# Patient Record
Sex: Female | Born: 1995 | Race: White | Hispanic: No | Marital: Married | Smoking: Never smoker
Health system: Southern US, Community
[De-identification: ages and names within clinical notes are randomized; demographics above are authoritative.]

## PROBLEM LIST (undated history)

## (undated) DIAGNOSIS — S129XXA Fracture of neck, unspecified, initial encounter: Secondary | ICD-10-CM

## (undated) DIAGNOSIS — S0291XA Unspecified fracture of skull, initial encounter for closed fracture: Secondary | ICD-10-CM

## (undated) DIAGNOSIS — J45909 Unspecified asthma, uncomplicated: Secondary | ICD-10-CM

---
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
## 2014-01-20 ENCOUNTER — Emergency Department: Payer: Self-pay | Admitting: Emergency Medicine

## 2014-02-01 ENCOUNTER — Emergency Department: Payer: Self-pay | Admitting: Emergency Medicine

## 2015-01-26 ENCOUNTER — Emergency Department: Payer: Self-pay | Admitting: Emergency Medicine

## 2015-03-16 ENCOUNTER — Encounter: Payer: Self-pay | Admitting: Podiatry

## 2015-03-16 ENCOUNTER — Ambulatory Visit (INDEPENDENT_AMBULATORY_CARE_PROVIDER_SITE_OTHER): Payer: BLUE CROSS/BLUE SHIELD | Admitting: Podiatry

## 2015-03-16 VITALS — BP 130/77 | HR 79 | Resp 16 | Ht 64.0 in | Wt 200.0 lb

## 2015-03-16 DIAGNOSIS — L600 Ingrowing nail: Secondary | ICD-10-CM

## 2015-03-16 MED ORDER — HYDROCODONE-ACETAMINOPHEN 10-325 MG PO TABS: 1.0000 | ORAL_TABLET | Freq: Three times a day (TID) | ORAL | Status: DC | PRN

## 2015-03-16 NOTE — Progress Notes (Signed)
   Subjective:    Patient ID: Rachael Schmidt, female    DOB: 06/14/1996, 19 y.o.   MRN: [id number]  HPI Comments: "I have an ingrown"  Patient c/o 1st toes bilateral, both borders, right over left, for 2-3 years off and on. The right is red, swollen and draining. She has been picking at them, clipping, soaking, and neosporin-no help this time. She has had both 1st toes cut at at podiatrist before.  She did not mention but noticed that the 2nd toe left, lateral border is red and swollen as well.  Toe Pain       Review of Systems  All other systems reviewed and are negative.      Objective:   Physical Exam        Assessment & Plan:

## 2015-03-16 NOTE — Patient Instructions (Signed)

## 2015-03-19 ENCOUNTER — Telehealth: Payer: Self-pay | Admitting: *Deleted

## 2015-03-19 ENCOUNTER — Ambulatory Visit: Payer: Self-pay | Admitting: Podiatry

## 2015-03-19 NOTE — Telephone Encounter (Signed)
Returned call-L/M-advised to switch soaks to epsom salt and water and continue using antibiotic ointment and cover. Can take Tylenol or Ibuprofen for pain. Call if no better.

## 2015-03-19 NOTE — Progress Notes (Signed)
Subjective:     Patient ID: Rachael Schmidt, female   DOB: 09/10/1996, 19 y.o.   MRN: [id number]  HPI patient presents with ingrown toenail deformity of both big toes second left that are sore and make it hard for her to wear shoe gear comfortably. States that she's tried to trim them herself without relief of symptoms and presents with her mother-in-law today   Review of Systems  All other systems reviewed and are negative.      Objective:   Physical Exam  Constitutional: She is oriented to person, place, and time.  Cardiovascular: Intact distal pulses.   Musculoskeletal: Normal range of motion.  Neurological: She is oriented to person, place, and time.  Skin: Skin is warm.  Nursing note and vitals reviewed.  neurovascular status intact with muscle strength adequate and range of motion subtalar midtarsal joint within normal limits. 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 I've recommended removal of the nail corners and explained the surgery and risk associated with doing this and patient wants the procedure. Patient understands he is far success understands all possible complications wants procedure and today I infiltrated with 180 mg Xylocaine Marcaine mixture remove the medial lateral borders the hallux bilateral and the medial border the second left exposed matrix and applied phenol 3 applications 30 seconds followed by alcohol lavaged E border followed by sterile dressings. Gave instructions on soaks and reappoint
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
## 2015-03-20 ENCOUNTER — Telehealth: Payer: Self-pay | Admitting: *Deleted

## 2015-03-20 MED ORDER — DOXYCYCLINE HYCLATE 100 MG PO TABS: 100.0000 mg | ORAL_TABLET | Freq: Two times a day (BID) | ORAL | Status: AC

## 2015-03-20 NOTE — Telephone Encounter (Signed)
Patient called stated that the toe has green stuff coming out of it, called in doxycyline into pharmacy for her . Continue with soaks

## 2015-03-26 ENCOUNTER — Encounter: Payer: Self-pay | Admitting: Podiatry

## 2015-03-26 ENCOUNTER — Ambulatory Visit (INDEPENDENT_AMBULATORY_CARE_PROVIDER_SITE_OTHER): Payer: BLUE CROSS/BLUE SHIELD | Admitting: Podiatry

## 2015-03-26 VITALS — Ht 64.0 in | Wt 200.0 lb

## 2015-03-26 DIAGNOSIS — L600 Ingrowing nail: Secondary | ICD-10-CM

## 2015-03-26 DIAGNOSIS — L03119 Cellulitis of unspecified part of limb: Secondary | ICD-10-CM

## 2015-03-26 DIAGNOSIS — L02619 Cutaneous abscess of unspecified foot: Secondary | ICD-10-CM

## 2015-03-26 MED ORDER — SULFAMETHOXAZOLE-TRIMETHOPRIM 800-160 MG PO TABS: ORAL_TABLET | ORAL | Status: AC

## 2015-03-26 MED ORDER — MUPIROCIN 2 % EX OINT: TOPICAL_OINTMENT | CUTANEOUS | Status: AC

## 2015-03-27 NOTE — Progress Notes (Signed)
She presents today 2 weeks status post matrixectomy hallux bilateral. However the hallux right has been painful and draining. She's recently been placed on antibiotics consisting of doxycycline 100 mg twice a day. She states that she's been soaking on a regular basis. She states that the toe still sore and swollen however it has become more easily walked upon and that she can bend the toe now better than she could before.  Objective: Vital signs are stable and oriented 3. The hallux right demonstrates erythema and edema some serosanguineous drainage and some green fibrin that position. I see no signs of probing to bone. At this point the patient does not want to have an IND performed.  Assessment: Abscess hallux right status post infection. Left foot appears to be healing well.  Plan: Discontinue doxycycline start her on Bactrim DS continue soaking twice daily and apply Bactroban ointment which is dispensed today. I will follow-up with her in 1 week at which time if this is not better we will consider an incision and drainage.

## 2015-04-02 ENCOUNTER — Ambulatory Visit: Payer: BLUE CROSS/BLUE SHIELD | Admitting: Podiatry

## 2015-06-23 IMAGING — CR DG CHEST 2V
1 series · 2 of 2 positions shown · non-contrast
Comparison: None.

CLINICAL DATA: Chest pain

EXAM:
CHEST  2 VIEW

[Series 1: pa · 0.17mm/px · 2 of 2 slices shown]
[im 1/2]
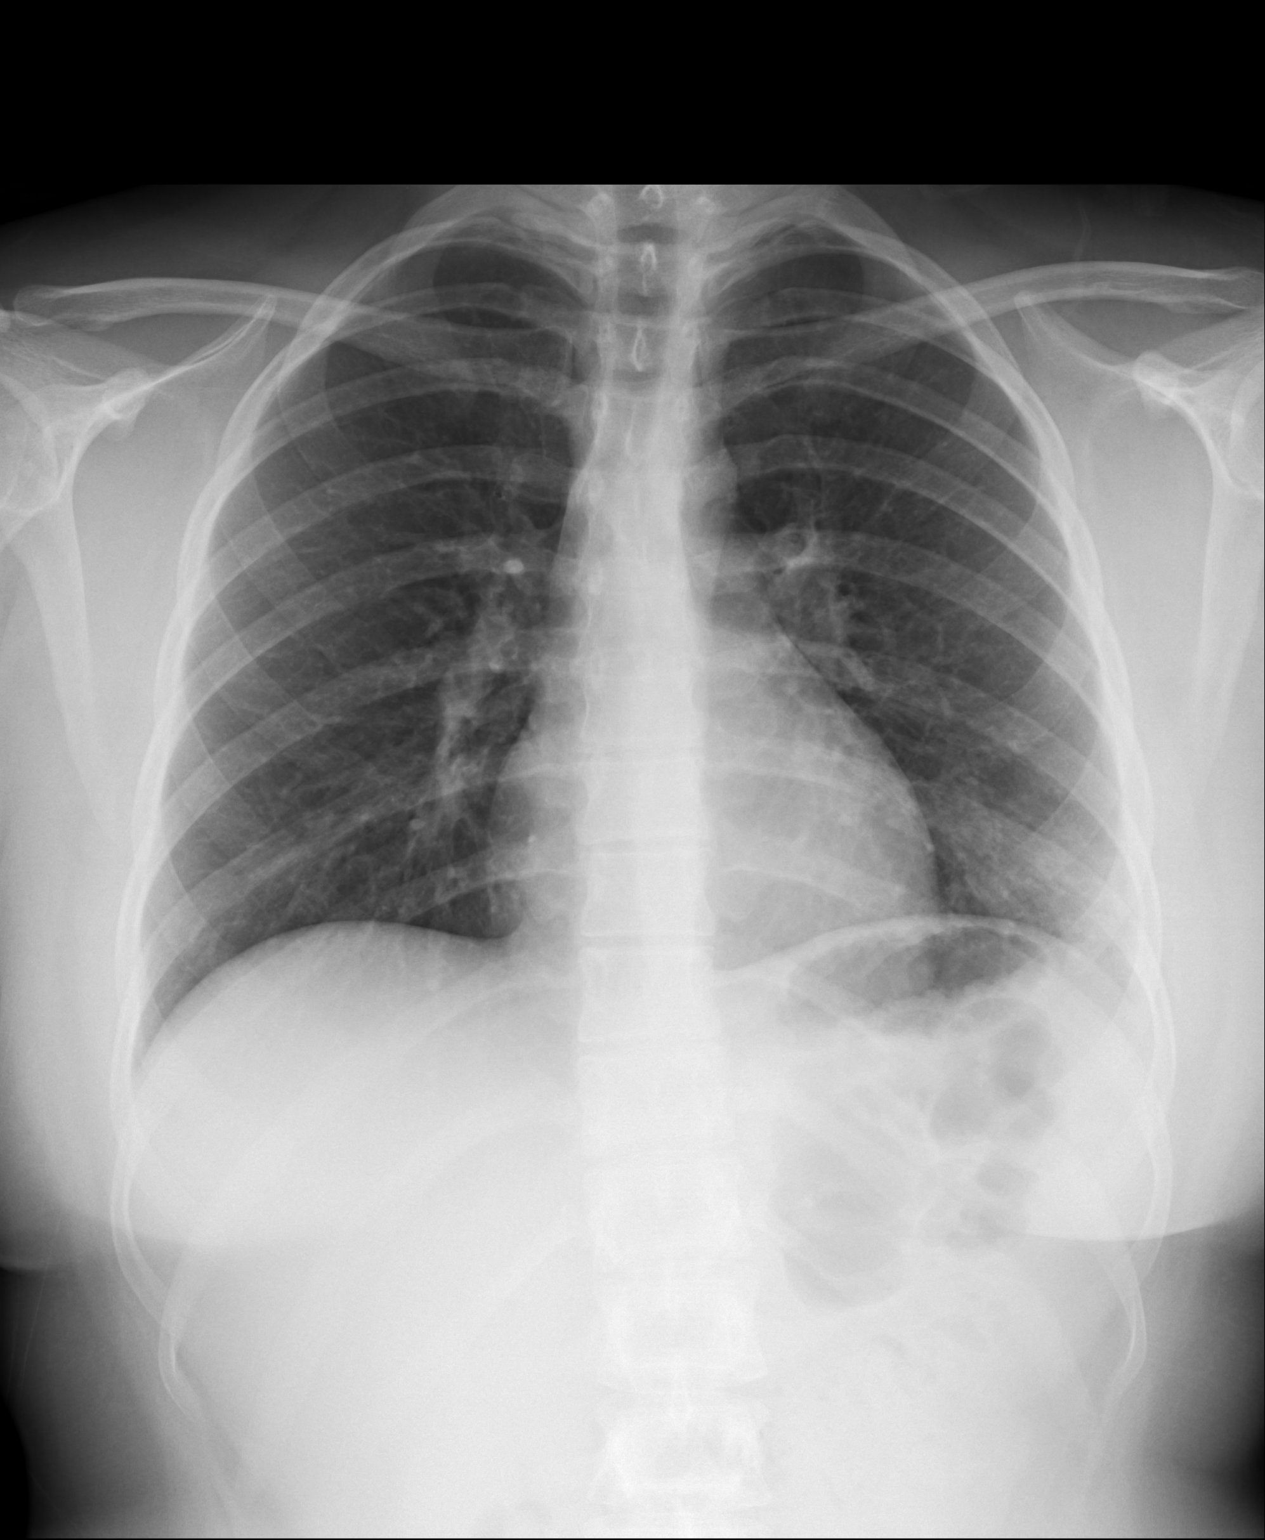
[im 2/2]
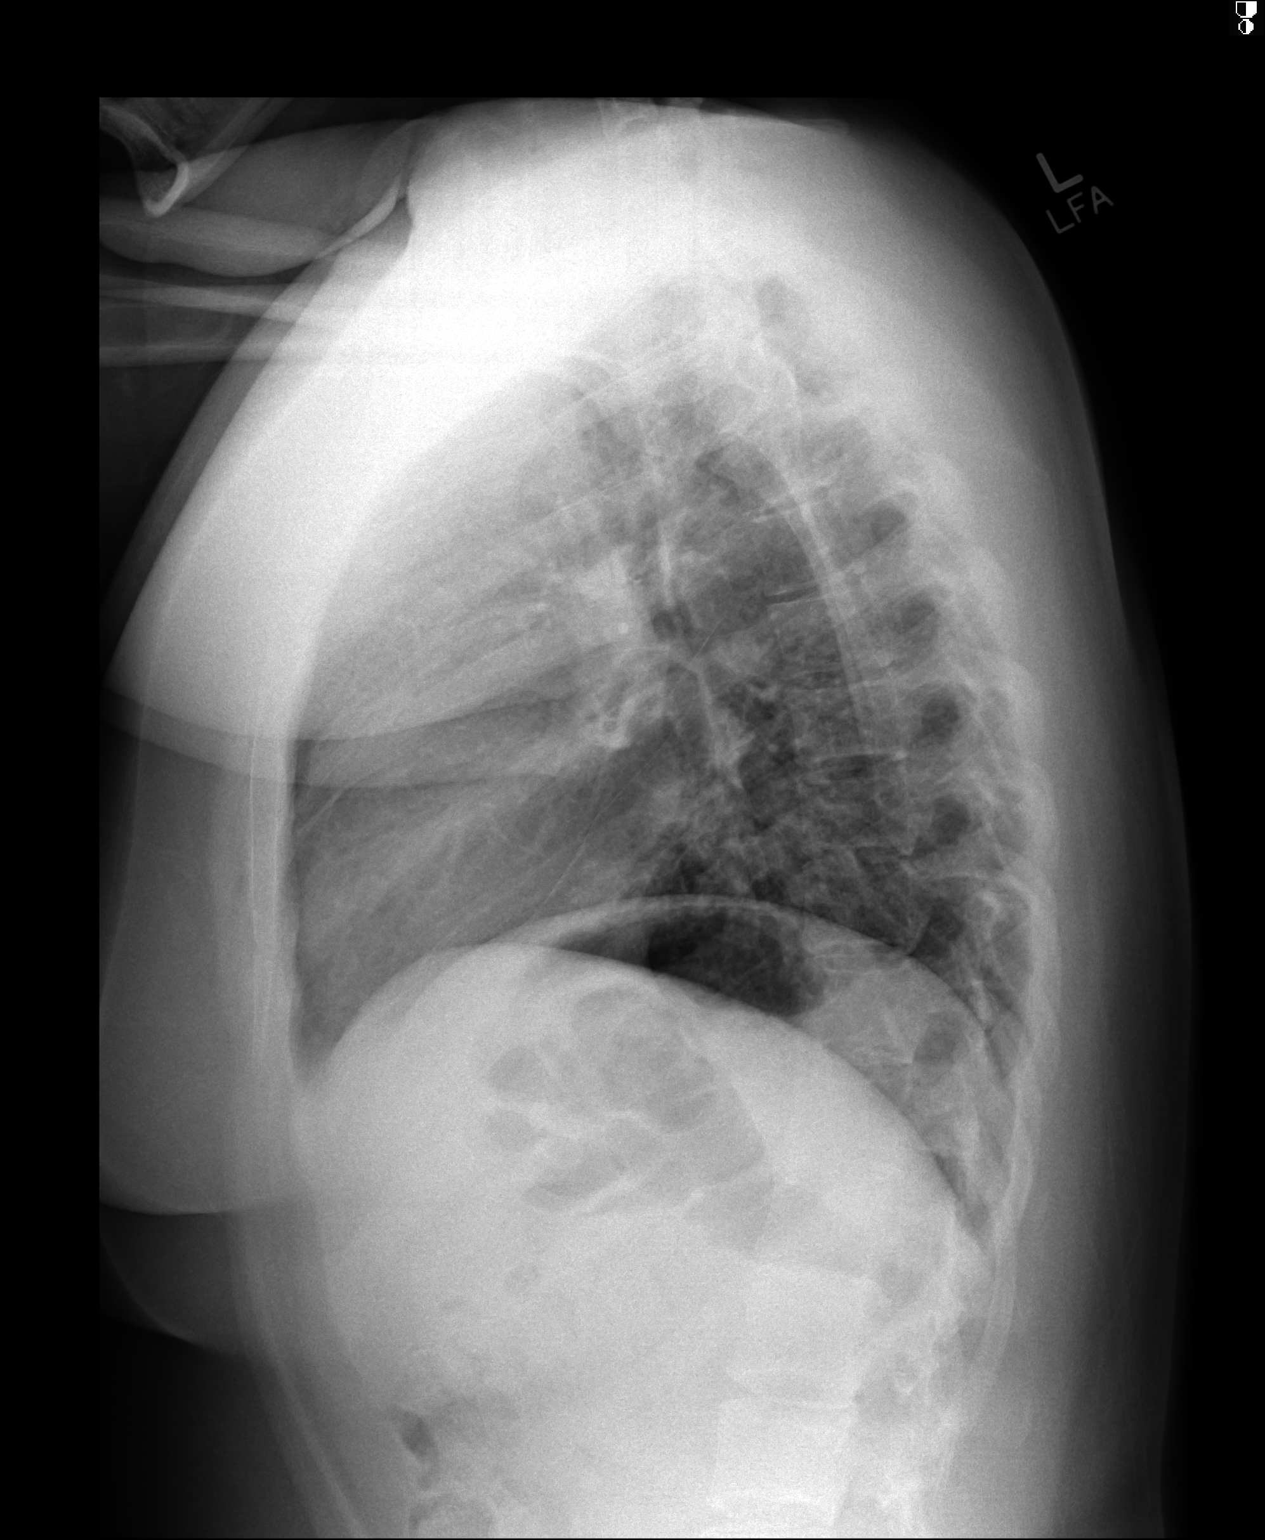

[2 of 2 positions shown; findings below may reference images not displayed]

FINDINGS: There is mild interstitial infiltrate in the left base. Lungs are
otherwise clear. Heart size and pulmonary vascularity are normal. No
adenopathy. No pneumothorax. No bone lesions.
IMPRESSION: Mild interstitial infiltrate left base.

## 2015-09-29 ENCOUNTER — Emergency Department: Payer: BLUE CROSS/BLUE SHIELD

## 2015-09-29 ENCOUNTER — Encounter: Payer: Self-pay | Admitting: Emergency Medicine

## 2015-09-29 ENCOUNTER — Emergency Department
Admission: EM | Admit: 2015-09-29 | Discharge: 2015-09-29 | Disposition: A | Discharge status: Home / Self Care | Payer: BLUE CROSS/BLUE SHIELD | Attending: Emergency Medicine | Admitting: Emergency Medicine

## 2015-09-29 DIAGNOSIS — T148XXA Other injury of unspecified body region, initial encounter: Secondary | ICD-10-CM

## 2015-09-29 DIAGNOSIS — Y998 Other external cause status: Secondary | ICD-10-CM | POA: Diagnosis not present

## 2015-09-29 DIAGNOSIS — Y9389 Activity, other specified: Secondary | ICD-10-CM | POA: Insufficient documentation

## 2015-09-29 DIAGNOSIS — S8001XA Contusion of right knee, initial encounter: Secondary | ICD-10-CM | POA: Diagnosis not present

## 2015-09-29 DIAGNOSIS — Z792 Long term (current) use of antibiotics: Secondary | ICD-10-CM | POA: Insufficient documentation

## 2015-09-29 DIAGNOSIS — Y92410 Unspecified street and highway as the place of occurrence of the external cause: Secondary | ICD-10-CM | POA: Insufficient documentation

## 2015-09-29 DIAGNOSIS — S139XXA Sprain of joints and ligaments of unspecified parts of neck, initial encounter: Secondary | ICD-10-CM

## 2015-09-29 DIAGNOSIS — Z880 Allergy status to penicillin: Secondary | ICD-10-CM | POA: Insufficient documentation

## 2015-09-29 DIAGNOSIS — S20312A Abrasion of left front wall of thorax, initial encounter: Secondary | ICD-10-CM | POA: Diagnosis not present

## 2015-09-29 DIAGNOSIS — S39012A Strain of muscle, fascia and tendon of lower back, initial encounter: Secondary | ICD-10-CM | POA: Insufficient documentation

## 2015-09-29 DIAGNOSIS — S134XXA Sprain of ligaments of cervical spine, initial encounter: Secondary | ICD-10-CM | POA: Insufficient documentation

## 2015-09-29 DIAGNOSIS — S199XXA Unspecified injury of neck, initial encounter: Secondary | ICD-10-CM | POA: Diagnosis present

## 2015-09-29 DIAGNOSIS — S0990XA Unspecified injury of head, initial encounter: Secondary | ICD-10-CM | POA: Diagnosis not present

## 2015-09-29 DIAGNOSIS — S20212A Contusion of left front wall of thorax, initial encounter: Secondary | ICD-10-CM | POA: Diagnosis not present

## 2015-09-29 HISTORY — DX: Fracture of neck, unspecified, initial encounter: S12.9XXA

## 2015-09-29 HISTORY — DX: Unspecified asthma, uncomplicated: J45.909

## 2015-09-29 HISTORY — DX: Unspecified fracture of skull, initial encounter for closed fracture: S02.91XA

## 2015-09-29 MED ORDER — ONDANSETRON 4 MG PO TBDP
4.0000 mg | ORAL_TABLET | Freq: Once | ORAL | Status: AC
  Administered 2015-09-29: 4 mg via ORAL
  Filled 2015-09-29: qty 1

## 2015-09-29 MED ORDER — IBUPROFEN 800 MG PO TABS
800.0000 mg | ORAL_TABLET | Freq: Once | ORAL | Status: AC
  Administered 2015-09-29: 800 mg via ORAL
  Filled 2015-09-29: qty 1

## 2015-09-29 MED ORDER — OXYCODONE-ACETAMINOPHEN 5-325 MG PO TABS
1.0000 | ORAL_TABLET | Freq: Once | ORAL | Status: AC
  Administered 2015-09-29: 1 via ORAL
  Filled 2015-09-29: qty 1

## 2015-09-29 MED ORDER — HYDROCODONE-ACETAMINOPHEN 5-325 MG PO TABS: 1.0000 | ORAL_TABLET | ORAL | Status: AC | PRN

## 2015-09-29 MED ORDER — CYCLOBENZAPRINE HCL 10 MG PO TABS: 10.0000 mg | ORAL_TABLET | Freq: Three times a day (TID) | ORAL | Status: AC | PRN

## 2015-09-29 MED ORDER — IBUPROFEN 800 MG PO TABS
800.0000 mg | ORAL_TABLET | Freq: Three times a day (TID) | ORAL | Status: AC | PRN
Start: 2015-09-29 — End: ?
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
## 2015-09-29 NOTE — ED Notes (Signed)
Patient transported to X-ray via stretcher 

## 2015-09-29 NOTE — ED Notes (Signed)
ER provider at bedside for eval. 

## 2016-01-08 ENCOUNTER — Ambulatory Visit
Admission: EM | Admit: 2016-01-08 | Discharge: 2016-01-08 | Disposition: A | Discharge status: Home / Self Care | Payer: BLUE CROSS/BLUE SHIELD | Attending: Family Medicine | Admitting: Family Medicine

## 2016-01-08 ENCOUNTER — Ambulatory Visit (INDEPENDENT_AMBULATORY_CARE_PROVIDER_SITE_OTHER): Payer: BLUE CROSS/BLUE SHIELD

## 2016-01-08 DIAGNOSIS — S8001XA Contusion of right knee, initial encounter: Secondary | ICD-10-CM

## 2016-01-08 DIAGNOSIS — S5002XA Contusion of left elbow, initial encounter: Secondary | ICD-10-CM | POA: Diagnosis not present

## 2016-01-08 LAB — PREGNANCY, URINE: Preg Test, Ur: NEGATIVE

## 2016-01-08 MED ORDER — TRAMADOL HCL 50 MG PO TABS: 50.0000 mg | ORAL_TABLET | Freq: Two times a day (BID) | ORAL | Status: AC | PRN

## 2016-01-08 MED ORDER — MELOXICAM 15 MG PO TABS: 15.0000 mg | ORAL_TABLET | Freq: Every day | ORAL | Status: AC

## 2016-01-08 MED ORDER — KETOROLAC TROMETHAMINE 60 MG/2ML IM SOLN
60.0000 mg | Freq: Once | INTRAMUSCULAR | Status: DC
Start: 2016-01-08 — End: 2016-01-08

## 2016-01-08 NOTE — ED Provider Notes (Signed)
CSN: [id number]     Arrival date & time 01/08/16  1242 History   First MD Initiated Contact with Patient 01/08/16 1339    .Nurses notes were reviewed. Chief Complaint  Patient presents with  . Fall  . Knee Pain  . Elbow Pain     patient reports Sunday night after dinner [place]. States she thinks she tripped over the rug with dad about 7-8 steps. She denies any loss of consciousness. She denies any significant pain at the left elbow but the right knee is swollen and very painful for her to walk. Her mother total pre-heat on it so she's been putting heat on it. No history of previous knee injury or elbow injury before in the past.   No significant medical problems other than asthma. She does not smoke no significant family medical history. (Consider location/radiation/quality/duration/timing/severity/associated sxs/prior Treatment) Patient is a 20 y.o. female presenting with fall and knee pain. The history is provided by the patient. No language interpreter was used.  Fall This is a new problem. The current episode started 2 days ago. The problem has been gradually worsening. Pertinent negatives include no chest pain, no abdominal pain, no headaches and no shortness of breath. The symptoms are aggravated by walking and bending. Nothing relieves the symptoms. She has tried a warm compress for the symptoms. The treatment provided no relief.  Knee Pain Location:  Knee Knee location:  R knee Pain details:    Quality:  Aching and pressure   Severity:  Moderate   Timing:  Constant Chronicity:  New Foreign body present:  No foreign bodies Relieved by:  None tried Worsened by:  Nothing tried Ineffective treatments:  Heat Associated symptoms: swelling   Risk factors: obesity   Risk factors: no concern for non-accidental trauma, no frequent fractures, no known bone disorder and no recent illness     Past Medical History  Diagnosis Date  . Asthma     . Neck fracture (HCC)   . Skull fracture [facility])    Past Surgical History  Procedure Laterality Date  . Leg surgery  2010   History reviewed. No pertinent family history. Social History  Substance Use Topics  . Smoking status: Never Smoker   . Smokeless tobacco: Never Used  . Alcohol Use: 0.0 oz/week    0 Standard drinks or equivalent per week     Comment: rarely   OB History    No data available     Review of Systems  Respiratory: Negative for shortness of breath.   Cardiovascular: Negative for chest pain.  Gastrointestinal: Negative for abdominal pain.  Musculoskeletal: Positive for myalgias and joint swelling.       Patient with left elbow pain as well  Neurological: Negative for headaches.  All other systems reviewed and are negative.   Allergies  Penicillins  Home Medications   Prior to Admission medications   Medication Sig Start Date End Date Taking? Authorizing Provider  albuterol (PROVENTIL HFA;VENTOLIN HFA) 108 (90 BASE) MCG/ACT inhaler Inhale into the lungs every 6 (six) hours as needed for wheezing or shortness of breath.   Yes Historical Provider, MD  ibuprofen (ADVIL,MOTRIN) 800 MG tablet Take 1 tablet (800 mg total) by mouth every 8 (eight) hours as needed. 09/29/15  Yes Ignacia Bayley, PA-C  cyclobenzaprine (FLEXERIL) 10 MG tablet Take 1 tablet (10 mg total) by mouth every 8 (eight) hours as needed for muscle spasms. 09/29/15   Molly Maduro  Tumey, PA-C  doxycycline (VIBRA-TABS) 100 MG tablet Take 1 tablet (100 mg total) by mouth 2 (two) times daily. 03/20/15   Lenn Sink, DPM  HYDROcodone-acetaminophen (NORCO) 5-325 MG tablet Take 1 tablet by mouth every 4 (four) hours as needed for moderate pain. 09/29/15   Ignacia Bayley, PA-C  meloxicam (MOBIC) 15 MG tablet Take 1 tablet (15 mg total) by mouth daily. 01/08/16   Hassan Rowan, MD  mupirocin ointment (BACTROBAN) 2 % Apply to wound twice a day. 03/26/15   Max T Hyatt, DPM  sulfamethoxazole-trimethoprim (BACTRIM  DS,SEPTRA DS) 800-160 MG per tablet Take two tablets by mouth twice daily until completed 03/26/15   Max T Hyatt, DPM  traMADol (ULTRAM) 50 MG tablet Take 1 tablet (50 mg total) by mouth every 12 (twelve) hours as needed for moderate pain or severe pain. Then taking it mainly at night to help provide relief when trying to sleep 01/08/16   Hassan Rowan, MD   Meds Ordered and Administered this Visit   Medications  ketorolac (TORADOL) injection 60 mg (not administered)    BP 125/55 mmHg  Pulse 83  Temp(Src) 98.2 F (36.8 C) (Tympanic)  Resp 16  Ht 5\' 6"  (1.676 m)  Wt 195 lb (88.451 kg)  BMI 31.49 kg/m2  SpO2 100%  LMP 11/23/2015 (Approximate) No data found.   Physical Exam  Constitutional: She is oriented to person, place, and time. She appears well-developed and well-nourished.  HENT:  Head: Normocephalic and atraumatic.  Eyes: Conjunctivae are normal. Pupils are equal, round, and reactive to light.  Neck: Normal range of motion. Neck supple.  Musculoskeletal: She exhibits tenderness.       Left elbow: She exhibits swelling. Tenderness found.       Legs: Patient has bruising over the right knee swelling is present as well. Pelvis no sensory ligamentous weakness or damage. Patient had discomfort with moving and palpating the knee down. There is ecchymosis around the right knee also.   Patient has tenderness over the left elbow but good range of motion. No marked ecchymosis over the elbow and no warmth.  Neurological: She is alert and oriented to person, place, and time.  Skin: Skin is warm. There is erythema.  Psychiatric: She has a normal mood and affect. Her behavior is normal.  Vitals reviewed.   ED Course  Procedures (including critical care time)  Labs Review Labs Reviewed  PREGNANCY, URINE    Imaging Review Dg Elbow Complete Left  01/08/2016  CLINICAL DATA:  Larey Seat down stairs 2 days ago.  Elbow pain EXAM: LEFT ELBOW - COMPLETE 3+ VIEW COMPARISON:  None. FINDINGS: There  is no evidence of fracture, dislocation, or joint effusion. There is no evidence of arthropathy or other focal bone abnormality. Soft tissues are unremarkable. IMPRESSION: Negative. Electronically Signed   By: Marlan Palau M.D.   On: 01/08/2016 14:26   Dg Knee Ap/lat W/sunrise Right  01/08/2016  CLINICAL DATA:  Pain following fall 2 days prior EXAM: RIGHT KNEE 3 VIEWS COMPARISON:  September 29, 2015 FINDINGS: Frontal, lateral, and sunrise patellar images were obtained. There is prepatellar soft tissue swelling. There is no demonstrable fracture or dislocation. No joint effusion. Joint spaces appear normal. No erosive change. IMPRESSION: There is a degree of prepatellar soft tissue swelling. No fracture or effusion. No appreciable arthropathy. Electronically Signed   By: Bretta Bang III M.D.   On: 01/08/2016 14:26     Visual Acuity Review  Right Eye Distance:   Left Eye  Distance:   Bilateral Distance:    Right Eye Near:   Left Eye Near:    Bilateral Near:         MDM   1. Knee contusion, right, initial encounter   2. Elbow contusion, left, initial encounter   3. Patellar contusion, right, initial encounter    Patient was placed on Mobic 15 mg recommend knee sleeve new pain with opening. She's been using heat recommend no heat for at least 3 or 4 days instead recommend cold therapy such as ice. Will also give her tramadol to let her sleep at night. Work note for Tuesday Wednesday and Thursday explained to her if she is not better by Thursday she'll need see her PCP for reevaluation and for possible extension on the work note    Note: This dictation was prepared with [employer] dictation along with smaller [employer]. Any transcriptional errors that result from this process are unintentional.    Hassan Rowan, MD 01/08/16 1453

## 2016-01-08 NOTE — Discharge Instructions (Signed)
Elbow Contusion  An elbow contusion is a deep bruise of the elbow. Contusions happen when an injury causes bleeding under the skin. Signs of bruising include pain, puffiness (swelling), and discolored skin. The contusion may turn blue, purple, or yellow. HOME CARE  Put ice on the injured area.  Put ice in a plastic bag.  Place a towel between your skin and the bag.  Leave the ice on for 15-20 minutes, 03-04 times a day.  Only take medicines as told by your doctor.  Rest your elbow until the pain and puffiness are better.  Raise (elevate) your elbow to lessen puffiness.  Put on an elastic wrap as told by your doctor. You can take it off for sleeping, showers, and baths. If your fingers get cold, blue, or lose feeling (numb), take the wrap off. Put the wrap back on more loosely.  Use your elbow only as told by your doctor. If you are asked to do elbow exercises, do them as told.  Keep all doctor visits as told. GET HELP RIGHT AWAY IF:  You have more redness, puffiness, or pain in your elbow.  Your puffiness or pain is not helped by medicines.  You have puffiness of the hand and fingers.  You are not able to move your fingers or wrist.  You start to lose feeling in your hand or fingers.  Your fingers or hand become cold or blue. MAKE SURE YOU:   Understand these instructions.  Will watch your condition.  Will get help right away if you are not doing well or get worse.   This information is not intended to replace advice given to you by your health care provider. Make sure you discuss any questions you have with your health care provider.   Document Released: 11/06/2011 Document Revised: 05/18/2012 Document Reviewed: 07/02/2015 Elsevier Interactive Patient Education 2016 Elsevier Inc.  Cryotherapy Cryotherapy is when you put ice on your injury. Ice helps lessen pain and puffiness (swelling) after an injury. Ice works the best when you start using it in the first 24 to  48 hours after an injury. HOME CARE  Put a dry or damp towel between the ice pack and your skin.  You may press gently on the ice pack.  Leave the ice on for no more than 10 to 20 minutes at a time.  Check your skin after 5 minutes to make sure your skin is okay.  Rest at least 20 minutes between ice pack uses.  Stop using ice when your skin loses feeling (numbness).  Do not use ice on someone who cannot tell you when it hurts. This includes small children and people with memory problems (dementia). GET HELP RIGHT AWAY IF:  You have white spots on your skin.  Your skin turns blue or pale.  Your skin feels waxy or hard.  Your puffiness gets worse. MAKE SURE YOU:   Understand these instructions.  Will watch your condition.  Will get help right away if you are not doing well or get worse.   This information is not intended to replace advice given to you by your health care provider. Make sure you discuss any questions you have with your health care provider.   Document Released: 05/05/2008 Document Revised: 02/09/2012 Document Reviewed: 07/10/2011 Elsevier Interactive Patient Education 2016 Elsevier Inc.  Contusion A contusion is a deep bruise. Contusions happen when an injury causes bleeding under the skin. Symptoms of bruising include pain, swelling, and discolored skin. The skin may  turn blue, purple, or yellow. HOME CARE   Rest the injured area.  If told, put ice on the injured area.  Put ice in a plastic bag.  Place a towel between your skin and the bag.  Leave the ice on for 20 minutes, 2-3 times per day.  If told, put light pressure (compression) on the injured area using an elastic bandage. Make sure the bandage is not too tight. Remove it and put it back on as told by your doctor.  If possible, raise (elevate) the injured area above the level of your heart while you are sitting or lying down.  Take over-the-counter and prescription medicines only as told  by your doctor. GET HELP IF:  Your symptoms do not get better after several days of treatment.  Your symptoms get worse.  You have trouble moving the injured area. GET HELP RIGHT AWAY IF:   You have very bad pain.  You have a loss of feeling (numbness) in a hand or foot.  Your hand or foot turns pale or cold.   This information is not intended to replace advice given to you by your health care provider. Make sure you discuss any questions you have with your health care provider.   Document Released: 05/05/2008 Document Revised: 08/08/2015 Document Reviewed: 04/04/2015 Elsevier Interactive Patient Education [employer]

## 2016-01-08 NOTE — ED Notes (Signed)
States fell down stairs Sunday landing on left arm and left knee "popped".

## 2016-08-11 ENCOUNTER — Emergency Department (HOSPITAL_BASED_OUTPATIENT_CLINIC_OR_DEPARTMENT_OTHER)
Admit: 2016-08-11 | Discharge: 2016-08-11 | Disposition: A | Discharge status: Home / Self Care | Payer: BLUE CROSS/BLUE SHIELD | Attending: Emergency Medicine | Admitting: Emergency Medicine

## 2016-08-11 ENCOUNTER — Emergency Department (HOSPITAL_COMMUNITY): Payer: BLUE CROSS/BLUE SHIELD

## 2016-08-11 ENCOUNTER — Encounter (HOSPITAL_COMMUNITY): Payer: Self-pay | Admitting: *Deleted

## 2016-08-11 ENCOUNTER — Emergency Department (HOSPITAL_COMMUNITY)
Admission: EM | Admit: 2016-08-11 | Discharge: 2016-08-11 | Disposition: A | Discharge status: Home / Self Care | Payer: BLUE CROSS/BLUE SHIELD | Attending: Emergency Medicine | Admitting: Emergency Medicine

## 2016-08-11 DIAGNOSIS — J45909 Unspecified asthma, uncomplicated: Secondary | ICD-10-CM | POA: Insufficient documentation

## 2016-08-11 DIAGNOSIS — M79662 Pain in left lower leg: Secondary | ICD-10-CM | POA: Diagnosis not present

## 2016-08-11 DIAGNOSIS — M79609 Pain in unspecified limb: Secondary | ICD-10-CM | POA: Diagnosis not present

## 2016-08-11 DIAGNOSIS — R071 Chest pain on breathing: Secondary | ICD-10-CM | POA: Diagnosis present

## 2016-08-11 DIAGNOSIS — R0789 Other chest pain: Secondary | ICD-10-CM | POA: Diagnosis not present

## 2016-08-11 DIAGNOSIS — M79605 Pain in left leg: Secondary | ICD-10-CM

## 2016-08-11 LAB — BASIC METABOLIC PANEL
Anion gap: 8 (ref 5–15)
BUN: 7 mg/dL (ref 6–20)
CALCIUM: 10.2 mg/dL (ref 8.9–10.3)
CO2: 25 mmol/L (ref 22–32)
CREATININE: 0.66 mg/dL (ref 0.44–1.00)
Chloride: 107 mmol/L (ref 101–111)
GFR calc non Af Amer: >60 mL/min (ref >60)
Glucose, Bld: 90 mg/dL (ref 65–99)
Potassium: 4.2 mmol/L (ref 3.5–5.1)
SODIUM: 140 mmol/L (ref 135–145)

## 2016-08-11 LAB — I-STAT TROPONIN, ED: TROPONIN I, POC: 0 ng/mL (ref 0.00–0.08)

## 2016-08-11 LAB — CBC
HCT: 42.4 % (ref 36.0–46.0)
Hemoglobin: 14 g/dL (ref 12.0–15.0)
MCH: 29.5 pg (ref 26.0–34.0)
MCHC: 33 g/dL (ref 30.0–36.0)
MCV: 89.5 fL (ref 78.0–100.0)
PLATELETS: 266 10*3/uL (ref 150–400)
RBC: 4.74 MIL/uL (ref 3.87–5.11)
RDW: 12.2 % (ref 11.5–15.5)
WBC: 9.7 10*3/uL (ref 4.0–10.5)

## 2016-08-11 LAB — D-DIMER, QUANTITATIVE: D-Dimer, Quant: <0.27 ug/mL-FEU (ref 0.00–0.50)

## 2016-08-11 NOTE — ED Notes (Signed)
Placed patient into a gown 

## 2016-08-11 NOTE — Discharge Instructions (Signed)
Please use number provide in this discharge paper to find a primary care provider for further management of your health. Return if your condition worsen or if you have other concerns. Take tylenol or ibuprofen at home as needed for your calf pain.

## 2016-08-11 NOTE — ED Notes (Signed)
Patient transported to Ultrasound 

## 2016-08-11 NOTE — ED Triage Notes (Signed)
Patient complains of chest pain that she describes as sharp since last pm, concerned because she first had calf pain the past 2 days, denies trauma. No cold or cough

## 2016-08-11 NOTE — ED Provider Notes (Signed)
[facility]-EMERGENCY DEPT Provider Note   CSN: [id number] Arrival date & time: 08/11/16  0859     History   Chief Complaint Chief Complaint  Patient presents with  . Leg Pain  . Chest Pain    HPI Rachael Schmidt is a 20 y.o. female.  HPI   20 year old female with history of asthma presenting with complaints of chest pain. Patient report 2 days ago she developed gradual onset of left calf pain. She described pain as a throbbing sensation, persistent, nothing seems to make it better or worse. She also felt there may be some associated swelling to her left leg. Yesterday she developed sharp diffuse chest pain, worsening with deep breathing with some associated mild shortness of breath when the pain is intense. The symptoms are new to her. She denies any recent strenuous activities or anything else that may have actually to her pain. She admits that she has been driving at least 4 hours daily to and from her job which she started approximate 2-3 weeks ago. She denies any prior history of PE or DVT, no recent surgery, prolonged bed rest, active cancer, taking oral hormone, or having hemoptysis. She is here at the urging of her grandmother who has a strong history of blood clot. Patient has history of asthma but denies any wheezing. No strong family history of cardiac disease.  Past Medical History:  Diagnosis Date  . Asthma   . Neck fracture (HCC)   . Skull fracture (HCC)     There are no active problems to display for this patient.   Past Surgical History:  Procedure Laterality Date  . LEG SURGERY  2010    OB History    No data available       Home Medications    Prior to Admission medications   Medication Sig Start Date End Date Taking? Authorizing Provider  albuterol (PROVENTIL HFA;VENTOLIN HFA) 108 (90 BASE) MCG/ACT inhaler Inhale into the lungs every 6 (six) hours as needed for wheezing or shortness of breath.    Historical Provider, MD  cyclobenzaprine (FLEXERIL) 10 MG  tablet Take 1 tablet (10 mg total) by mouth every 8 (eight) hours as needed for muscle spasms. 09/29/15   Ignacia Bayley, PA-C  doxycycline (VIBRA-TABS) 100 MG tablet Take 1 tablet (100 mg total) by mouth 2 (two) times daily. 03/20/15   Lenn Sink, DPM  HYDROcodone-acetaminophen (NORCO) 5-325 MG tablet Take 1 tablet by mouth every 4 (four) hours as needed for moderate pain. 09/29/15   Ignacia Bayley, PA-C  ibuprofen (ADVIL,MOTRIN) 800 MG tablet Take 1 tablet (800 mg total) by mouth every 8 (eight) hours as needed. 09/29/15   Ignacia Bayley, PA-C  meloxicam (MOBIC) 15 MG tablet Take 1 tablet (15 mg total) by mouth daily. 01/08/16   Hassan Rowan, MD  mupirocin ointment (BACTROBAN) 2 % Apply to wound twice a day. 03/26/15   Max T Hyatt, DPM  sulfamethoxazole-trimethoprim (BACTRIM DS,SEPTRA DS) 800-160 MG per tablet Take two tablets by mouth twice daily until completed 03/26/15   Max T Hyatt, DPM  traMADol (ULTRAM) 50 MG tablet Take 1 tablet (50 mg total) by mouth every 12 (twelve) hours as needed for moderate pain or severe pain. Then taking it mainly at night to help provide relief when trying to sleep 01/08/16   Hassan Rowan, MD    Family History No family history on file.  Social History Social History  Substance Use Topics  . Smoking status: Never Smoker  . Smokeless tobacco:  Never Used  . Alcohol use 0.0 oz/week     Comment: rarely     Allergies   Penicillins   Review of Systems Review of Systems  All other systems reviewed and are negative.    Physical Exam Updated Vital Signs BP 134/89 (BP Location: Left Arm)   Pulse 69   Temp 98.4 F (36.9 C) (Oral)   Resp 18   SpO2 100%   Physical Exam  Constitutional: She appears well-developed and well-nourished. No distress.  Obese female laying in bed in no acute discomfort.  HENT:  Head: Atraumatic.  Eyes: Conjunctivae are normal.  Neck: Neck supple. No JVD present.  Cardiovascular: Normal rate.   Pulmonary/Chest: Effort normal and  breath sounds normal. She exhibits no tenderness.  Abdominal: Soft. There is no tenderness.  Musculoskeletal: She exhibits tenderness (Mild tenderness to left calf region on palpation. Bilateral lower shin to use without palpable cords, erythema, or edema. Intact distal pedal pulses.).  Neurological: She is alert.  Skin: No rash noted.  Psychiatric: She has a normal mood and affect.  Nursing note and vitals reviewed.    ED Treatments / Results  Labs (all labs ordered are listed, but only abnormal results are displayed) Labs Reviewed  BASIC METABOLIC PANEL  CBC  D-DIMER, QUANTITATIVE (NOT AT [facility])  I-STAT TROPOININ, ED  POC URINE PREG, ED    EKG  EKG Interpretation None      Date: 08/11/2016  Rate: 71  Rhythm: normal sinus rhythm with sinus arrhythmia  QRS Axis: normal  Intervals: normal  ST/T Wave abnormalities: normal  Conduction Disutrbances: none  Narrative Interpretation:   Old EKG Reviewed: No significant changes noted     Radiology Dg Chest Port 1 View  Result Date: 08/11/2016 CLINICAL DATA:  Left anterior chest since last night. History of asthma. EXAM: PORTABLE CHEST 1 VIEW COMPARISON:  02/01/2014 FINDINGS: Lungs are adequately inflated without consolidation or effusion. Cardiomediastinal silhouette and remainder of the exam is unchanged. Moderate overlying soft tissues. IMPRESSION: No active disease. Electronically Signed   By: Elberta Fortis M.D.   On: 08/11/2016 10:55    Procedures Procedures (including critical care time)  Progress Notes Date of Service: 08/11/2016 11:29 AM Candace Leotis Shames, RVS  Vascular Lab    [] Hide copied text [] Hover for attribution information VASCULAR LAB PRELIMINARY  PRELIMINARY  PRELIMINARY  PRELIMINARY  Left lower extremity venous duplex completed.    Preliminary report:  There is no DVT or SVT noted in the left lower extremity.   KANADY, CANDACE, RVT 08/11/2016, 11:29 AM         Medications Ordered in  ED Medications - No data to display   Initial Impression / Assessment and Plan / ED Course  I have reviewed the triage vital signs and the nursing notes.  Pertinent labs & imaging results that were available during my care of the patient were reviewed by me and considered in my medical decision making (see chart for details).  Clinical Course    BP 117/75 (BP Location: Right Arm)   Pulse 77   Temp 98.4 F (36.9 C) (Oral)   Resp 20   SpO2 98%    Final Clinical Impressions(s) / ED Diagnoses   Final diagnoses:  Left leg pain  Other chest pain    New Prescriptions New Prescriptions   No medications on file   10:30 AM Patient presents with left calf pain and now having sharp chest pain. Pain is atypical for ACS. With her increased  traveling, and obesity, she increased risk for DVT. We'll perform a Doppler ultrasound of her left lower extremities. Even though I have low suspicion for PE, I will obtain a d-dimer. Will monitor closely.  12:36 PM Doppler study of the left lower extremities show no evidence of DVT. Her d-dimer is negative therefore low suspicion for PE. EKG shows no acute ischemic changes, normal troponin, and other labs are otherwise reassuring. At this time I have not identified any finding to suggest an acute emergent medical condition. I encouraged patient to find a primary care provider for further management of her health. Return precaution discussed. Otherwise patient is stable for discharge.    Fayrene HelperBowie Krishang Reading, PA-C 08/11/16 1239    Rolan BuccoMelanie Belfi, MD 08/11/16 1432

## 2016-08-11 NOTE — Progress Notes (Signed)
VASCULAR LAB PRELIMINARY  PRELIMINARY  PRELIMINARY  PRELIMINARY  Left lower extremity venous duplex completed.    Preliminary report:  There is no DVT or SVT noted in the left lower extremity.   Cyrene Gharibian, RVT 08/11/2016, 11:29 AM

## 2017-04-23 ENCOUNTER — Encounter: Payer: BLUE CROSS/BLUE SHIELD | Admitting: Advanced Practice Midwife

## 2018-01-12 IMAGING — CR DG CHEST 1V PORT
1 series · 1 of 1 positions shown · non-contrast
Comparison: 02/01/2014

CLINICAL DATA: Left anterior chest since last night. History of
asthma.

EXAM:
PORTABLE CHEST 1 VIEW

[AP]
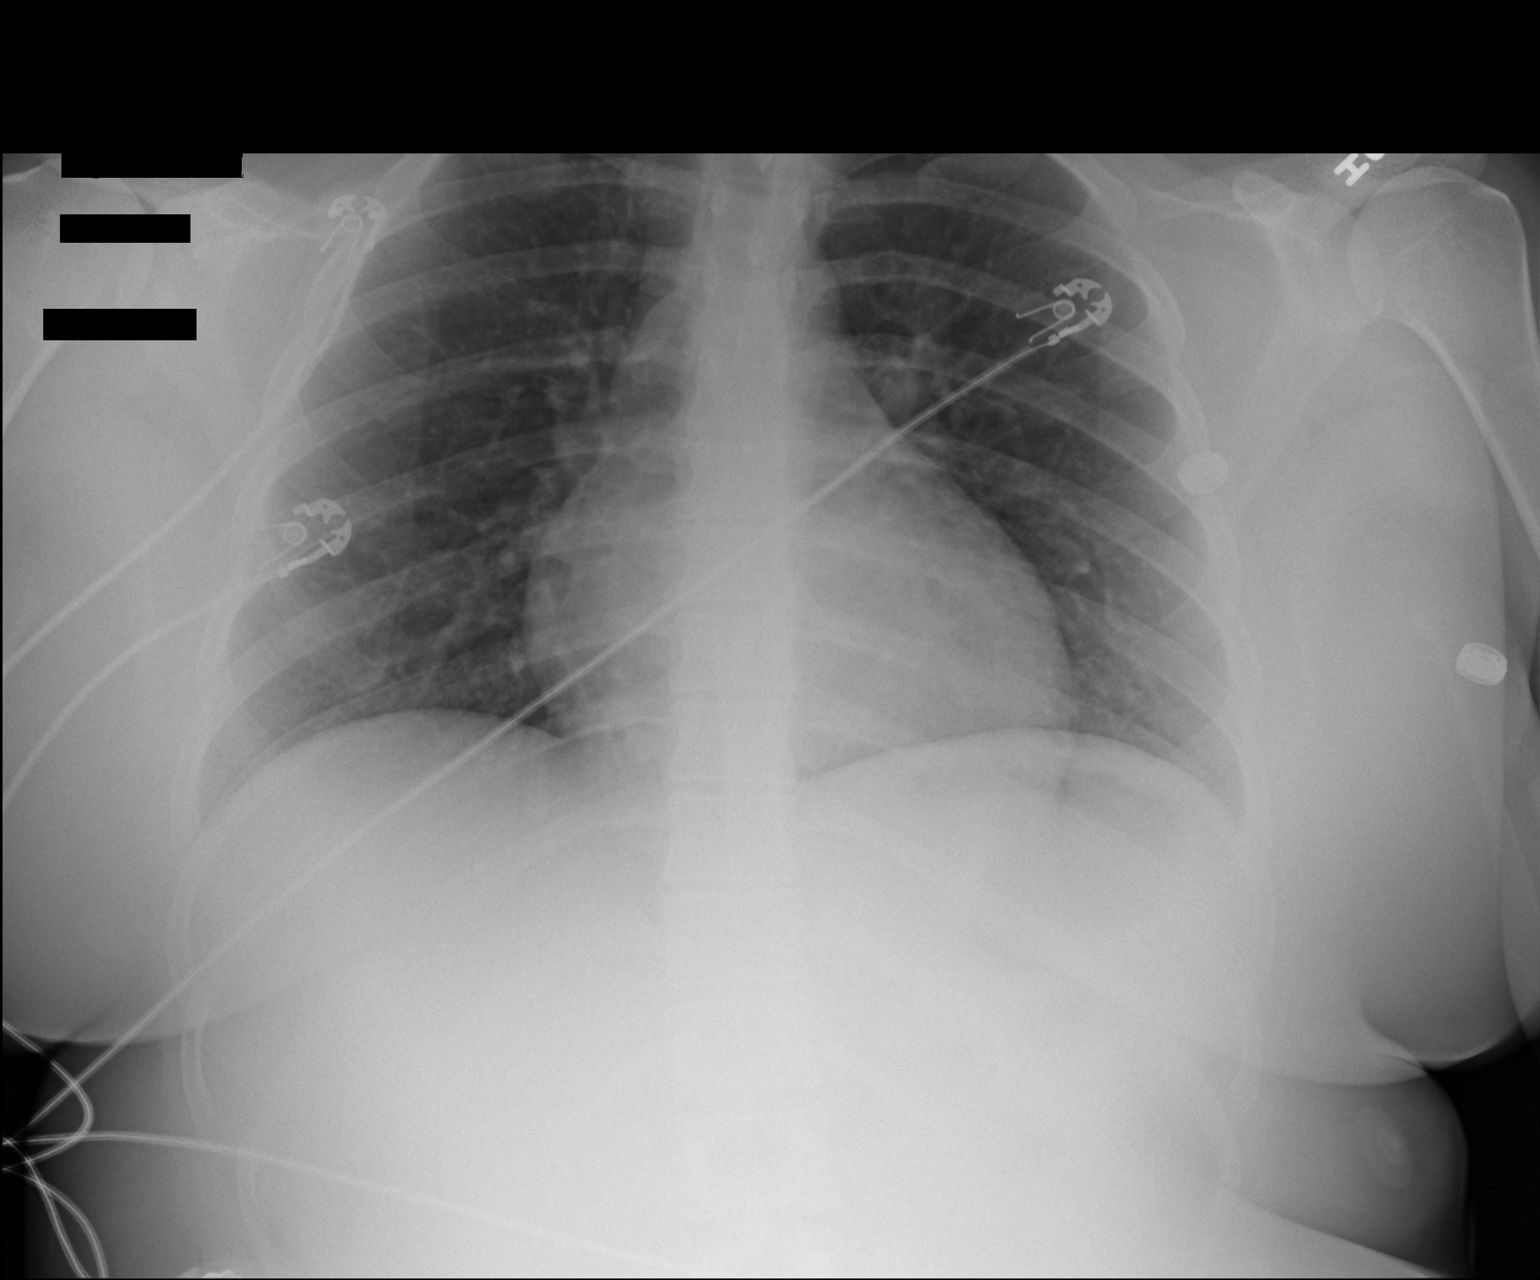

[1 of 1 positions shown; findings below may reference images not displayed]

FINDINGS: Lungs are adequately inflated without consolidation or effusion.
Cardiomediastinal silhouette and remainder of the exam is unchanged.
Moderate overlying soft tissues.
IMPRESSION: No active disease.

## 2018-03-17 ENCOUNTER — Ambulatory Visit: Payer: BLUE CROSS/BLUE SHIELD | Admitting: Obstetrics and Gynecology

## 2018-04-13 ENCOUNTER — Ambulatory Visit (INDEPENDENT_AMBULATORY_CARE_PROVIDER_SITE_OTHER): Payer: BLUE CROSS/BLUE SHIELD | Admitting: Obstetrics and Gynecology

## 2018-04-13 ENCOUNTER — Encounter: Payer: Self-pay | Admitting: Obstetrics and Gynecology

## 2018-04-13 VITALS — BP 128/80 | Ht 66.0 in | Wt 217.0 lb

## 2018-04-13 DIAGNOSIS — Z23 Encounter for immunization: Secondary | ICD-10-CM

## 2018-04-13 DIAGNOSIS — Z113 Encounter for screening for infections with a predominantly sexual mode of transmission: Secondary | ICD-10-CM | POA: Diagnosis not present

## 2018-04-13 DIAGNOSIS — Z30011 Encounter for initial prescription of contraceptive pills: Secondary | ICD-10-CM

## 2018-04-13 DIAGNOSIS — Z0000 Encounter for general adult medical examination without abnormal findings: Secondary | ICD-10-CM

## 2018-04-13 MED ORDER — LEVONORGEST-ETH ESTRAD 91-DAY 0.15-0.03 MG PO TABS: 1.0000 | ORAL_TABLET | Freq: Every day | ORAL | 4 refills | Status: AC

## 2018-04-13 MED ORDER — TETANUS-DIPHTH-ACELL PERTUSSIS 5-2.5-18.5 LF-MCG/0.5 IM SUSP
0.5000 mL | Freq: Once | INTRAMUSCULAR | Status: AC
  Administered 2018-04-13: 0.5 mL via INTRAMUSCULAR

## 2018-04-13 NOTE — Addendum Note (Signed)
Addended by: Cornelius Moras D on: 04/13/2018 02:53 PM   Modules accepted: Orders

## 2018-04-13 NOTE — Progress Notes (Signed)
Gynecology Annual Exam  PCP: Patient, No Pcp Per  Chief Complaint:  Chief Complaint  Patient presents with  . Gynecologic Exam    History of Present Illness: Patient is a 22 y.o. G0P0000 presents for annual exam. The patient has no complaints today.   LMP: Patient's last menstrual period was 03/02/2018. Menarche:11 Average Interval: irregular, 40 days Duration of flow: 2-5 days, heavier if she misses a month days Heavy Menses: no Clots: no Intermenstrual Bleeding: no Postcoital Bleeding: no Dysmenorrhea: no  The patient is sexually active. She currently uses none for contraception. She denies dyspareunia.  The patient does perform self breast exams.  There is notable family history of breast or ovarian cancer in her family.  The patient wears seatbelts: yes.  The patient has regular exercise: yes.    The patient denies current symptoms of depression.    Review of Systems: ROS  Past Medical History:  Past Medical History:  Diagnosis Date  . Asthma   . Neck fracture (HCC)   . Skull fracture [facility])     Past Surgical History:  Past Surgical History:  Procedure Laterality Date  . LEG SURGERY  2010    Gynecologic History:  Patient's last menstrual period was 03/02/2018. Contraception: none Last Pap: Results were: 2018 NIL   Obstetric History: G0P0000  Family History:  History reviewed. No pertinent family history.  Social History:  Social History   Socioeconomic History  . Marital status: Married    Spouse name: Not on file  . Number of children: Not on file  . Years of education: Not on file  . Highest education level: Not on file  Occupational History  . Not on file  Social Needs  . Financial resource strain: Not on file  . Food insecurity:    Worry: Not on file    Inability: Not on file  . Transportation needs:    Medical: Not on file    Non-medical: Not on file  Tobacco Use  . Smoking status: Never Smoker  . Smokeless tobacco: Never Used    Substance and Sexual Activity  . Alcohol use: Yes    Alcohol/week: 0.0 oz    Comment: rarely  . Drug use: Not on file  . Sexual activity: Yes    Birth control/protection: None  Lifestyle  . Physical activity:    Days per week: Not on file    Minutes per session: Not on file  . Stress: Not on file  Relationships  . Social connections:    Talks on phone: Not on file    Gets together: Not on file    Attends religious service: Not on file    Active member of club or organization: Not on file    Attends meetings of clubs or organizations: Not on file    Relationship status: Not on file  . Intimate partner violence:    Fear of current or ex partner: Not on file    Emotionally abused: Not on file    Physically abused: Not on file    Forced sexual activity: Not on file  Other Topics Concern  . Not on file  Social History Narrative  . Not on file    Allergies:  Allergies  Allergen Reactions  . Penicillins Rash    Medications: Prior to Admission medications   Medication Sig Start Date End Date Taking? Authorizing Provider  albuterol (PROVENTIL HFA;VENTOLIN HFA) 108 (90 BASE) MCG/ACT inhaler Inhale into the lungs every 6 (six) hours  as needed for wheezing or shortness of breath.    [provider]  cyclobenzaprine (FLEXERIL) 10 MG tablet Take 1 tablet (10 mg total) by mouth every 8 (eight) hours as needed for muscle spasms. Patient not taking: Reported on 08/11/2016 09/29/15   Ignacia Bayley, PA-C  doxycycline (VIBRA-TABS) 100 MG tablet Take 1 tablet (100 mg total) by mouth 2 (two) times daily. Patient not taking: Reported on 08/11/2016 03/20/15   Lenn Sink, DPM  HYDROcodone-acetaminophen (NORCO) 5-325 MG tablet Take 1 tablet by mouth every 4 (four) hours as needed for moderate pain. Patient not taking: Reported on 08/11/2016 09/29/15   Ignacia Bayley, PA-C  ibuprofen (ADVIL,MOTRIN) 800 MG tablet Take 1 tablet (800 mg total) by mouth every 8 (eight) hours as  needed. Patient not taking: Reported on 08/11/2016 09/29/15   Ignacia Bayley, PA-C  meloxicam (MOBIC) 15 MG tablet Take 1 tablet (15 mg total) by mouth daily. Patient not taking: Reported on 08/11/2016 01/08/16   Hassan Rowan, MD  mupirocin ointment (BACTROBAN) 2 % Apply to wound twice a day. Patient not taking: Reported on 08/11/2016 03/26/15   Elinor Parkinson, DPM  sulfamethoxazole-trimethoprim (BACTRIM DS,SEPTRA DS) 800-160 MG per tablet Take two tablets by mouth twice daily until completed Patient not taking: Reported on 08/11/2016 03/26/15   Hyatt, Max T, DPM  traMADol (ULTRAM) 50 MG tablet Take 1 tablet (50 mg total) by mouth every 12 (twelve) hours as needed for moderate pain or severe pain. Then taking it mainly at night to help provide relief when trying to sleep Patient not taking: Reported on 08/11/2016 01/08/16   Hassan Rowan, MD    Physical Exam Vitals: Blood pressure 128/80, height  (1.676 m), weight 217 lb (98.4 kg), last menstrual period 03/02/2018.  General: NAD HEENT: normocephalic, anicteric Thyroid: no enlargement, no palpable nodules Pulmonary: No increased work of breathing, CTAB Cardiovascular: RRR, distal pulses 2+ Breast: Breast symmetrical, no tenderness, no palpable nodules or masses, no skin or nipple retraction present, no nipple discharge.  No axillary or supraclavicular lymphadenopathy. Abdomen: NABS, soft, non-tender, non-distended.  Umbilicus without lesions.  No hepatomegaly, splenomegaly or masses palpable. No evidence of hernia  Genitourinary:  External: Normal external female genitalia.  Normal urethral meatus, normal Bartholin's and Skene's glands.    Vagina: Normal vaginal mucosa, no evidence of prolapse.    Cervix: Grossly normal in appearance, no bleeding  Uterus: Non-enlarged, mobile, normal contour.  No CMT  Adnexa: ovaries non-enlarged, no adnexal masses  Rectal: deferred  Lymphatic: no evidence of inguinal lymphadenopathy Extremities: no edema,  erythema, or tenderness Neurologic: Grossly intact Psychiatric: mood appropriate, affect full  Female chaperone present for pelvic and breast  portions of the physical exam    Assessment: 22 y.o. G0P0000 routine annual exam  Plan: Problem List Items Addressed This Visit    None      1) 4) Gardasil Series discussed and if applicable offered to patient - Patient has previously completed 3 shot series   2) STI screening  wasoffered and declined  3)  ASCCP guidelines and rational discussed.  Patient opts for every 3 years screening interval  4) Contraception - the patient is currently using  none.  She is interested in changing to extended cycle oral contraceptive pills We discussed safe sex practices to reduce her furture risk of STI's.   Discussed quick start algorithm because of recent unprotected intercourse on Saturday. Patient decline plan B. She would like to start today. Will follow up in 2  weeks for a urine pregnancy test or take one at home. Urine pregnancy test today is negative.   5) Discussed that with her history of irregular cycles and infertility she may have PCOS, she declines evaluation for that at this time.  6) Return in about 1 year (around 04/14/2019).  Adelene Idler MD [facility], [facility] 04/13/18 1:47 PM

## 2018-04-14 LAB — GC/CHLAMYDIA PROBE AMP
Chlamydia trachomatis, NAA: NEGATIVE
Neisseria gonorrhoeae by PCR: NEGATIVE

## 2018-04-14 LAB — BASIC METABOLIC PANEL
BUN / CREAT RATIO: 8 — AB (ref 9–23)
BUN: 6 mg/dL (ref 6–20)
CO2: 20 mmol/L (ref 20–29)
CREATININE: 0.71 mg/dL (ref 0.57–1.00)
Calcium: 9.9 mg/dL (ref 8.7–10.2)
Chloride: 105 mmol/L (ref 96–106)
GFR calc Af Amer: 138 mL/min/{1.73_m2} (ref >59)
GFR, EST NON AFRICAN AMERICAN: 120 mL/min/{1.73_m2} (ref >59)
Glucose: 79 mg/dL (ref 65–99)
POTASSIUM: 3.8 mmol/L (ref 3.5–5.2)
SODIUM: 140 mmol/L (ref 134–144)

## 2018-04-14 LAB — CBC
HEMATOCRIT: 38.4 % (ref 34.0–46.6)
HEMOGLOBIN: 12.9 g/dL (ref 11.1–15.9)
MCH: 29.5 pg (ref 26.6–33.0)
MCHC: 33.6 g/dL (ref 31.5–35.7)
MCV: 88 fL (ref 79–97)
Platelets: 314 10*3/uL (ref 150–379)
RBC: 4.37 x10E6/uL (ref 3.77–5.28)
RDW: 13.1 % (ref 12.3–15.4)
WBC: 12.7 10*3/uL — ABNORMAL HIGH (ref 3.4–10.8)

## 2018-04-14 LAB — HIV ANTIBODY (ROUTINE TESTING W REFLEX): HIV SCREEN 4TH GENERATION: NONREACTIVE

## 2018-04-14 NOTE — Progress Notes (Signed)
Please contact patient and notify her of normal labs. Thank you, Dr. Jerene Pitch
